# Patient Record
Sex: Male | Born: 2005 | Race: White | Hispanic: No | Smoking: Never smoker
Health system: Southern US, Community
[De-identification: ages and names within clinical notes are randomized; demographics above are authoritative.]

## PROBLEM LIST (undated history)

## (undated) HISTORY — PX: TONSILLECTOMY AND ADENOIDECTOMY: SUR1326

---
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
## 2023-01-15 ENCOUNTER — Ambulatory Visit
Admission: EM | Admit: 2023-01-15 | Discharge: 2023-01-15 | Disposition: A | Discharge status: Home / Self Care | Payer: Medicaid Other | Attending: Emergency Medicine | Admitting: Emergency Medicine

## 2023-01-15 DIAGNOSIS — Z113 Encounter for screening for infections with a predominantly sexual mode of transmission: Secondary | ICD-10-CM | POA: Insufficient documentation

## 2023-01-15 DIAGNOSIS — R300 Dysuria: Secondary | ICD-10-CM | POA: Diagnosis not present

## 2023-01-15 DIAGNOSIS — H9202 Otalgia, left ear: Secondary | ICD-10-CM | POA: Insufficient documentation

## 2023-01-15 LAB — POCT URINALYSIS DIP (MANUAL ENTRY)
Bilirubin, UA: NEGATIVE
Blood, UA: NEGATIVE
Glucose, UA: NEGATIVE mg/dL
Ketones, POC UA: NEGATIVE mg/dL
Nitrite, UA: NEGATIVE
Protein Ur, POC: NEGATIVE mg/dL
Spec Grav, UA: >=1.03 — AB (ref 1.010–1.025)
Urobilinogen, UA: 0.2 E.U./dL
pH, UA: 6.5 (ref 5.0–8.0)
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
## 2023-01-15 NOTE — Discharge Instructions (Addendum)
Your tests are pending.  If your test results are positive, we will call you.  You and your sexual partner(s) may require treatment at that time.  Do not have sexual activity for at least 7 days.    Follow up with your primary care provider if your symptoms are not improving.
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
## 2023-01-17 LAB — CYTOLOGY, (ORAL, ANAL, URETHRAL) ANCILLARY ONLY
Chlamydia: POSITIVE — AB
Comment: NEGATIVE
Comment: NEGATIVE
Comment: NORMAL
Neisseria Gonorrhea: NEGATIVE
Trichomonas: NEGATIVE

## 2023-01-18 ENCOUNTER — Telehealth: Payer: Self-pay | Admitting: Emergency Medicine

## 2023-01-18 MED ORDER — DOXYCYCLINE HYCLATE 100 MG PO CAPS: 100.0000 mg | ORAL_CAPSULE | Freq: Two times a day (BID) | ORAL | 0 refills | Status: AC

## 2023-01-30 ENCOUNTER — Ambulatory Visit
Admission: EM | Admit: 2023-01-30 | Discharge: 2023-01-30 | Disposition: A | Discharge status: Home / Self Care | Payer: Medicaid Other | Attending: Urgent Care | Admitting: Urgent Care

## 2023-01-30 DIAGNOSIS — Z113 Encounter for screening for infections with a predominantly sexual mode of transmission: Secondary | ICD-10-CM | POA: Diagnosis present

## 2023-01-30 NOTE — Discharge Instructions (Signed)
Follow up here or with your primary care provider if your symptoms are worsening or not improving.     

## 2023-01-30 NOTE — ED Triage Notes (Signed)
Patient to Urgent Care for STD testing. No known exposure.   Recently treated for chlamydia. Completed course of medication. Does still have some stinging.

## 2023-01-30 NOTE — ED Provider Notes (Signed)
Carl Jefferson    CSN: [id number] Arrival date & time: 01/30/23  1825      History   Chief Complaint Chief Complaint  Patient presents with   SEXUALLY TRANSMITTED DISEASE    HPI Carl Jefferson is a 17 y.o. male.   HPI  Presents urgent care for STD screening.  No known exposure.  He endorses recent treatment for chlamydia and completion of medication course.  He does continue to have some "stinging".  History reviewed. No pertinent past medical history.  Patient Active Problem List   Diagnosis Date Noted   Acute right ankle pain 09/18/2016    Past Surgical History:  Procedure Laterality Date   TONSILLECTOMY AND ADENOIDECTOMY         Home Medications    Prior to Admission medications   Not on File    Family History Family History  Problem Relation Age of Onset   Anuerysm Paternal Uncle    Heart disease Paternal Grandmother    Diabetes Paternal Grandmother    Heart disease Paternal Grandfather     Social History Social History   Tobacco Use   Smoking status: Never   Smokeless tobacco: Never  Substance Use Topics   Alcohol use: No    Alcohol/week: 0.0 standard drinks of alcohol   Drug use: No     Allergies   Patient has no known allergies.   Review of Systems Review of Systems   Physical Exam Triage Vital Signs ED Triage Vitals  Encounter Vitals Group     BP 01/30/23 1840 110/67     Systolic BP Percentile --      Diastolic BP Percentile --      Pulse Rate 01/30/23 1840 77     Resp 01/30/23 1840 18     Temp 01/30/23 1840 98.5 F (36.9 C)     Temp src --      SpO2 01/30/23 1840 96 %     Weight --      Height --      Head Circumference --      Peak Flow --      Pain Score 01/30/23 1842 0     Pain Loc --      Pain Education --      Exclude from Growth Chart --    No data found.  Updated Vital Signs BP 110/67   Pulse 77   Temp 98.5 F (36.9 C)   Resp 18   SpO2 96%   Visual Acuity Right Eye Distance:   Left Eye  Distance:   Bilateral Distance:    Right Eye Near:   Left Eye Near:    Bilateral Near:     Physical Exam Vitals reviewed.  Constitutional:      Appearance: Normal appearance.  Skin:    General: Skin is warm and dry.  Neurological:     General: No focal deficit present.     Mental Status: He is alert and oriented to person, place, and time.  Psychiatric:        Mood and Affect: Mood normal.        Behavior: Behavior normal.      UC Treatments / Results  Labs (all labs ordered are listed, but only abnormal results are displayed) Labs Reviewed  CYTOLOGY, (ORAL, ANAL, URETHRAL) ANCILLARY ONLY    EKG   Radiology No results found.  Procedures Procedures (including critical care time)  Medications Ordered in UC Medications - No data to display  Initial Impression / Assessment and Plan / UC Course  I have reviewed the triage vital signs and the nursing notes.  Pertinent labs & imaging results that were available during my care of the patient were reviewed by me and considered in my medical decision making (see chart for details).   Urethral cytology is obtained and results pending   Final Clinical Impressions(s) / UC Diagnoses   Final diagnoses:  Screen for STD (sexually transmitted disease)   Discharge Instructions   None    ED Prescriptions   None    PDMP not reviewed this encounter.   Charma Igo, [place] 01/30/23 1851

## 2023-07-09 ENCOUNTER — Ambulatory Visit
Admission: EM | Admit: 2023-07-09 | Discharge: 2023-07-09 | Disposition: A | Discharge status: Home / Self Care | Payer: Medicaid Other | Attending: Emergency Medicine | Admitting: Emergency Medicine

## 2023-07-09 DIAGNOSIS — A084 Viral intestinal infection, unspecified: Secondary | ICD-10-CM | POA: Diagnosis not present

## 2023-07-09 LAB — POC COVID19/FLU A&B COMBO
Covid Antigen, POC: NEGATIVE
Influenza A Antigen, POC: NEGATIVE
Influenza B Antigen, POC: NEGATIVE

## 2023-07-09 MED ORDER — CYCLOBENZAPRINE HCL 10 MG PO TABS: 10.0000 mg | ORAL_TABLET | Freq: Two times a day (BID) | ORAL | 0 refills | Status: AC | PRN

## 2023-07-09 MED ORDER — LOPERAMIDE HCL 2 MG PO CAPS: 2.0000 mg | ORAL_CAPSULE | Freq: Four times a day (QID) | ORAL | 0 refills | Status: AC | PRN

## 2023-07-09 MED ORDER — ONDANSETRON 4 MG PO TBDP: 4.0000 mg | ORAL_TABLET | Freq: Three times a day (TID) | ORAL | 0 refills | Status: AC | PRN

## 2023-07-09 NOTE — Discharge Instructions (Signed)
 Your symptoms are most likely caused by a virus, it will work its way out your system over the next few days  COVID and flu test are negative  You may use muscle relaxant twice daily in addition to Tylenol and ibuprofen to help manage body aches  May continue use of Tums  You can use zofran  every 8 hours as needed for nausea, be mindful this medication may make you drowsy, take the first dose at home to see how it affects your body  You can use Imodium  to help with diarrhea, and be mindful over use of this medication may cause opposite effect constipation  You can use over-the-counter ibuprofen or Tylenol, which ever you have at home, to help manage fevers  Continue to promote hydration throughout the day by using electrolyte replacement solution such as Gatorade, body armor, Pedialyte, which ever you have at home  Try eating bland foods such as bread, rice, toast, fruit which are easier on the stomach to digest, avoid foods that are overly spicy, overly seasoned or greasy

## 2023-07-09 NOTE — ED Triage Notes (Signed)
 Patient to Urgent Care with complaints of headache, emesis, diarrhea and body aches.  Symptoms started today.

## 2023-07-09 NOTE — ED Provider Notes (Signed)
 Carl Jefferson    CSN: [id number] Arrival date & time: 07/09/23  1612      History   Chief Complaint Chief Complaint  Patient presents with   Emesis    HPI Carl Jefferson is a 18 y.o. male.   Patient presents for evaluation of vomiting, diarrhea and abdominal pain beginning today upon awakening.  Abdominal pain has improved since use of Tums.  Has had at least 10 occurrences of vomiting and 7 occurrences of watery diarrhea.  Has been able to tolerate fluids as the time has progressed but unable to tolerate foods.  No known sick contacts prior.  Denies cold and flu symptoms.  History of a tonsillar   History reviewed. No pertinent past medical history.  Patient Active Problem List   Diagnosis Date Noted   Acute right ankle pain 09/18/2016    Past Surgical History:  Procedure Laterality Date   TONSILLECTOMY AND ADENOIDECTOMY         Home Medications    Prior to Admission medications   Medication Sig Start Date End Date Taking? Authorizing Provider  cyclobenzaprine  (FLEXERIL ) 10 MG tablet Take 1 tablet (10 mg total) by mouth 2 (two) times daily as needed for muscle spasms. 07/09/23  Yes Fay Bagg R, NP  loperamide  (IMODIUM ) 2 MG capsule Take 1 capsule (2 mg total) by mouth 4 (four) times daily as needed for diarrhea or loose stools. 07/09/23  Yes Cherisa Brucker R, NP  ondansetron  (ZOFRAN -ODT) 4 MG disintegrating tablet Take 1 tablet (4 mg total) by mouth every 8 (eight) hours as needed. 07/09/23  Yes Arah Aro, Shelba SAUNDERS, NP    Family History Family History  Problem Relation Age of Onset   Anuerysm Paternal Uncle    Heart disease Paternal Grandmother    Diabetes Paternal Grandmother    Heart disease Paternal Grandfather     Social History Social History   Tobacco Use   Smoking status: Never   Smokeless tobacco: Never  Substance Use Topics   Alcohol use: No    Alcohol/week: 0.0 standard drinks of alcohol   Drug use: No     Allergies   Patient has  no known allergies.   Review of Systems Review of Systems   Physical Exam Triage Vital Signs ED Triage Vitals  Encounter Vitals Group     BP 07/09/23 1650 102/61     Systolic BP Percentile --      Diastolic BP Percentile --      Pulse Rate 07/09/23 1650 (!) 120     Resp 07/09/23 1650 16     Temp 07/09/23 1650 99 F (37.2 C)     Temp src --      SpO2 07/09/23 1650 99 %     Weight --      Height --      Head Circumference --      Peak Flow --      Pain Score 07/09/23 1635 7     Pain Loc --      Pain Education --      Exclude from Growth Chart --    No data found.  Updated Vital Signs BP 102/61   Pulse (!) 120   Temp 99 F (37.2 C)   Resp 16   SpO2 99%   Visual Acuity Right Eye Distance:   Left Eye Distance:   Bilateral Distance:    Right Eye Near:   Left Eye Near:    Bilateral Near:  Physical Exam Constitutional:      Appearance: He is ill-appearing.  HENT:     Head: Normocephalic.  Eyes:     Extraocular Movements: Extraocular movements intact.  Pulmonary:     Effort: Pulmonary effort is normal.  Abdominal:     General: Abdomen is flat. Bowel sounds are increased.     Palpations: Abdomen is soft.     Tenderness: There is generalized abdominal tenderness.  Neurological:     Mental Status: He is alert and oriented to person, place, and time. Mental status is at baseline.      UC Treatments / Results  Labs (all labs ordered are listed, but only abnormal results are displayed) Labs Reviewed  POC COVID19/FLU A&B COMBO    EKG   Radiology No results found.  Procedures Procedures (including critical care time)  Medications Ordered in UC Medications - No data to display  Initial Impression / Assessment and Plan / UC Course  I have reviewed the triage vital signs and the nursing notes.  Pertinent labs & imaging results that were available during my care of the patient were reviewed by me and considered in my medical decision making (see  chart for details).  Viral gastroenteritis  Vitals are stable, patient is in no signs of distress nontoxic-appearing, COVID and flu testing negative, stable for outpatient management, etiology is most likely viral as adenovirus has become more prevalent in the area, prescribed Zofran  and Imodium  and Flexeril , recommended increase fluid intake with food as tolerated with follow-up with urgent care as needed Final Clinical Impressions(s) / UC Diagnoses   Final diagnoses:  Viral gastroenteritis     Discharge Instructions      Your symptoms are most likely caused by a virus, it will work its way out your system over the next few days  COVID and flu test are negative  You may use muscle relaxant twice daily in addition to Tylenol and ibuprofen to help manage body aches  May continue use of Tums  You can use zofran  every 8 hours as needed for nausea, be mindful this medication may make you drowsy, take the first dose at home to see how it affects your body  You can use Imodium  to help with diarrhea, and be mindful over use of this medication may cause opposite effect constipation  You can use over-the-counter ibuprofen or Tylenol, which ever you have at home, to help manage fevers  Continue to promote hydration throughout the day by using electrolyte replacement solution such as Gatorade, body armor, Pedialyte, which ever you have at home  Try eating bland foods such as bread, rice, toast, fruit which are easier on the stomach to digest, avoid foods that are overly spicy, overly seasoned or greasy    ED Prescriptions     Medication Sig Dispense Auth. Provider   ondansetron  (ZOFRAN -ODT) 4 MG disintegrating tablet Take 1 tablet (4 mg total) by mouth every 8 (eight) hours as needed. 20 tablet Marquis Diles R, NP   loperamide  (IMODIUM ) 2 MG capsule Take 1 capsule (2 mg total) by mouth 4 (four) times daily as needed for diarrhea or loose stools. 12 capsule Deyani Hegarty R, NP    cyclobenzaprine  (FLEXERIL ) 10 MG tablet Take 1 tablet (10 mg total) by mouth 2 (two) times daily as needed for muscle spasms. 20 tablet Yamin Swingler R, NP      PDMP not reviewed this encounter.   Teresa Shelba SAUNDERS, NP 07/09/23 1730
